# Patient Record
Sex: Female | Born: 2000 | Hispanic: Yes | Marital: Single | Smoking: Never smoker
Health system: Southern US, Community
[De-identification: ages and names within clinical notes are randomized; demographics above are authoritative.]

## PROBLEM LIST (undated history)

## (undated) HISTORY — PX: OTHER SURGICAL HISTORY: SHX169

---
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
## 2021-03-31 ENCOUNTER — Other Ambulatory Visit: Payer: Self-pay

## 2021-03-31 ENCOUNTER — Ambulatory Visit
Admission: RE | Admit: 2021-03-31 | Discharge: 2021-03-31 | Disposition: A | Discharge status: Home / Self Care | Payer: Managed Care, Other (non HMO) | Source: Ambulatory Visit | Attending: Pediatrics | Admitting: Pediatrics

## 2021-03-31 DIAGNOSIS — R9431 Abnormal electrocardiogram [ECG] [EKG]: Secondary | ICD-10-CM | POA: Diagnosis present

## 2021-03-31 DIAGNOSIS — R002 Palpitations: Secondary | ICD-10-CM | POA: Diagnosis present

## 2021-04-21 ENCOUNTER — Telehealth: Payer: Self-pay | Admitting: Internal Medicine

## 2021-04-21 NOTE — Telephone Encounter (Signed)
I was contacted by Dr. Shanon Rosser to review EKG performed on 03/31/2021 for evaluation of palpitations.  Tracing demonstrates normal sinus rhythm.  Q waves in leads II and aVF are borderline significant and most likely represent normal variant.  Overall morphology is very similar to prior tracings dating back to 2017, though higher voltage in the limb leads is noted on this most recent tracing (suspect differences in technique).  In the absence of ischemic symptoms, I would not pursue ischemia work-up at this time.  If ongoing cardiac concerns persist, we would be happy to evaluate the patient in our office.  Findings and recommendations were discussed with Dr. Shanon Rosser.  Yvonne Kendall, MD [facility]

## 2021-07-23 IMAGING — CR DG CERVICAL SPINE COMPLETE 4+V
5 series · 5 of 5 positions shown · non-contrast
Comparison: 12/10/2010

CLINICAL DATA: Cervicalgia.

EXAM:
CERVICAL SPINE - COMPLETE 4+ VIEW

[c-spine lat]
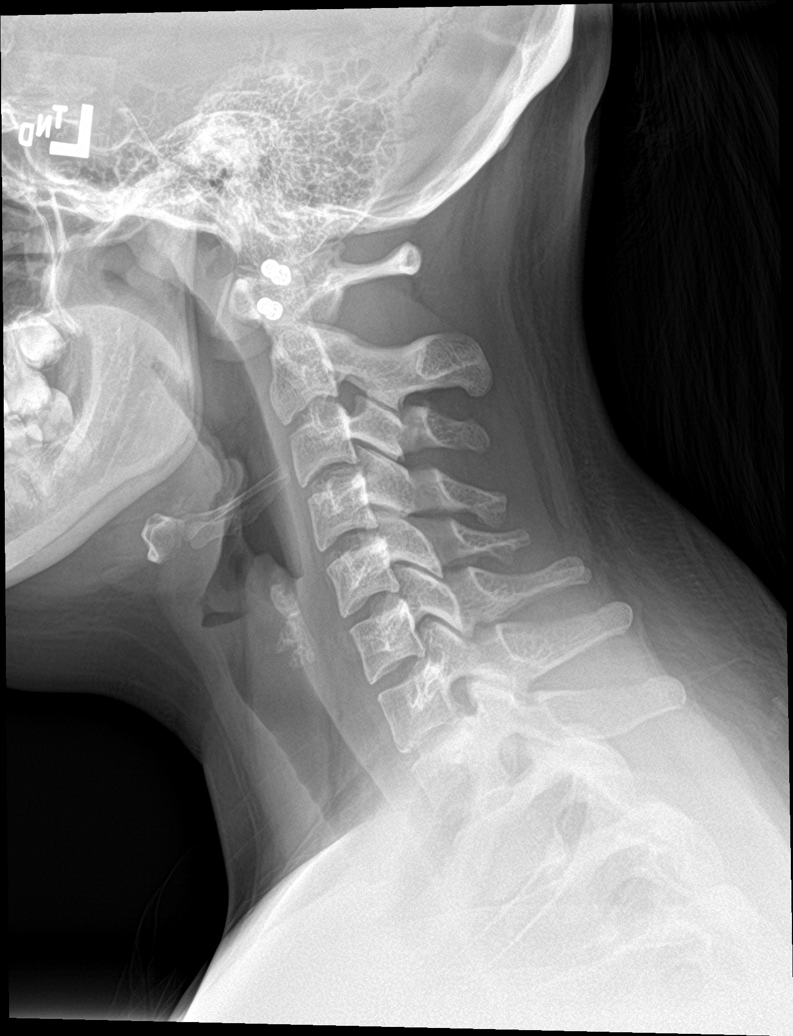

[c-spine obl (1 of 2)]
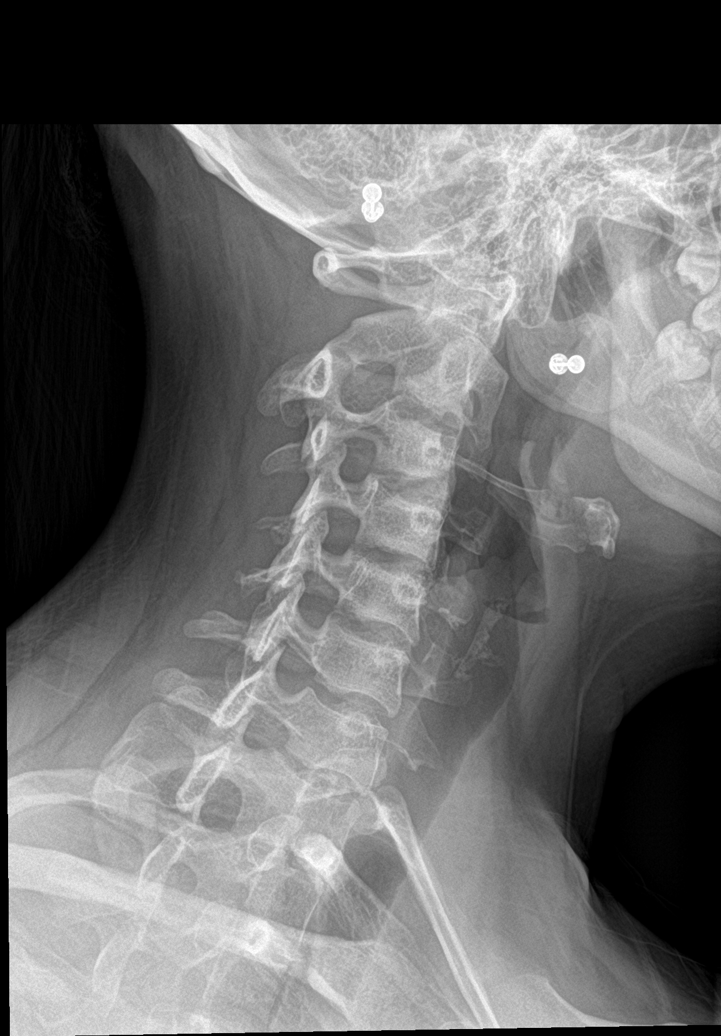

[c-spine obl (2 of 2)]
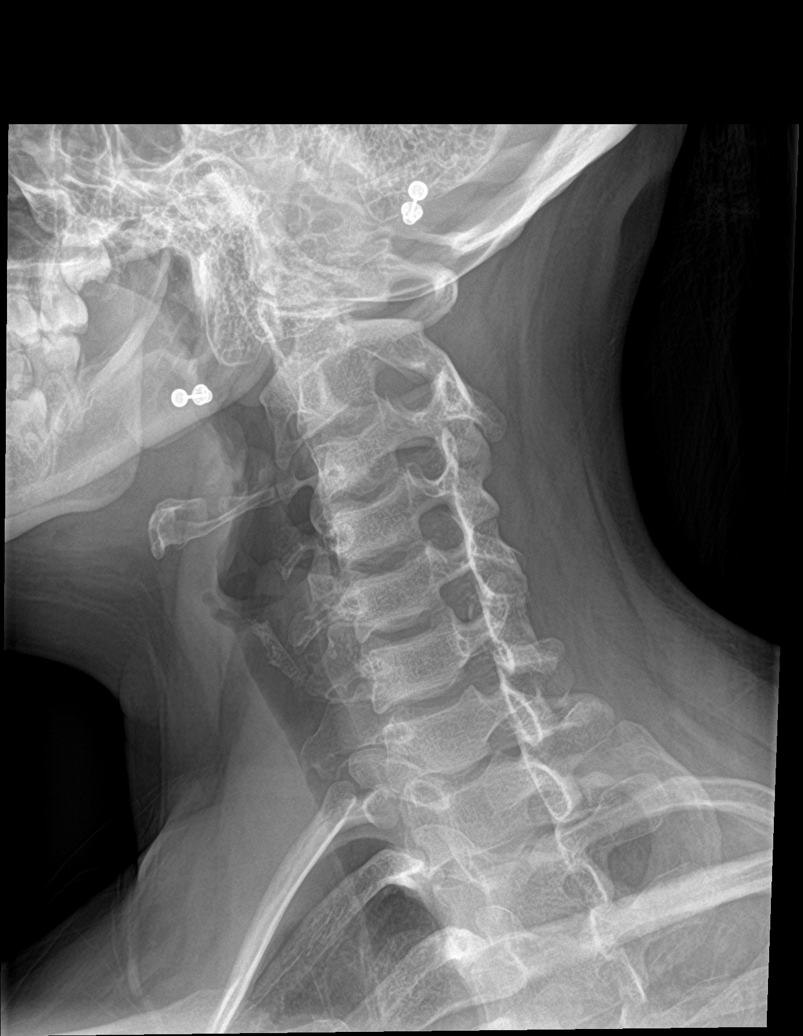

[c-spine ap]
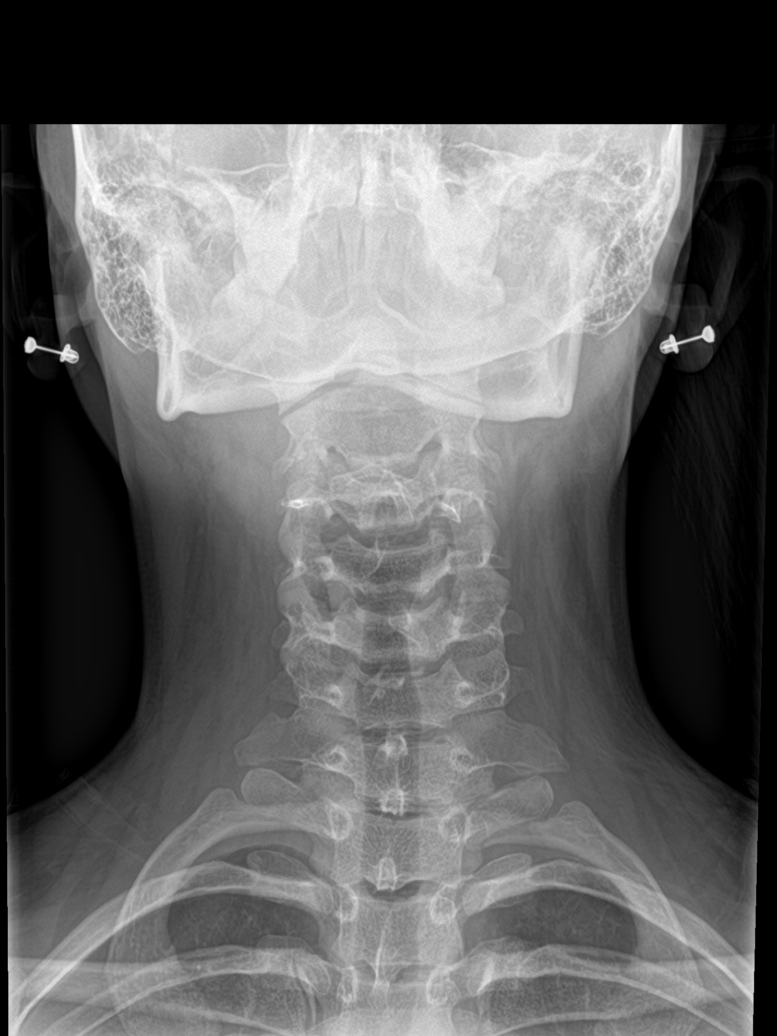

[c-spine open mouth]
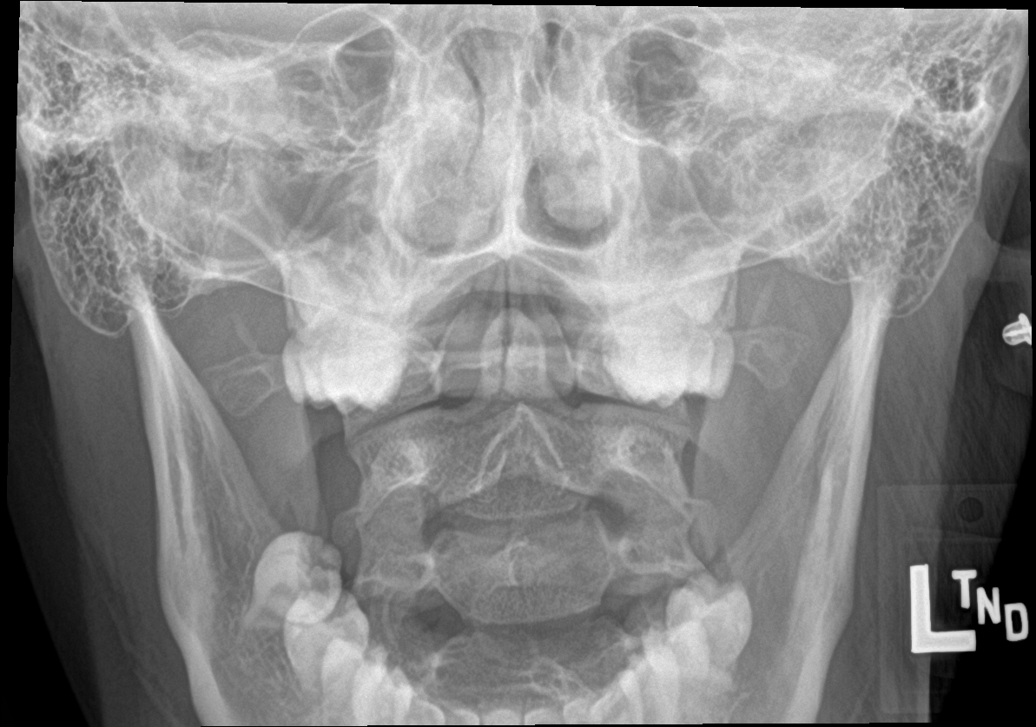

[5 of 5 positions shown; findings below may reference images not displayed]

FINDINGS: There is no evidence of cervical spine fracture or prevertebral soft
tissue swelling. Alignment is normal. No other significant bone
abnormalities are identified.
IMPRESSION: Negative cervical spine radiographs.

## 2023-10-04 ENCOUNTER — Telehealth: Payer: Self-pay

## 2023-10-04 NOTE — Telephone Encounter (Signed)
 Dr Clent Ridges is leaving the practice and your New Patient or Transfer of Care appointment needs to be rescheduled with another provider. Please call the office to schedule a Transfer of Care to either Dr Charlann Lange, Darleen Crocker or Kara Dies, NP.   Thank you  E2C2, please reschedule this patient's TOC visit when they call back. [facility]

## 2024-01-26 ENCOUNTER — Encounter: Payer: Self-pay | Admitting: Advanced Practice Midwife

## 2024-01-31 ENCOUNTER — Ambulatory Visit: Payer: Managed Care, Other (non HMO) | Admitting: Family Medicine
